# Patient Record
Sex: Female | Born: 1945 | Race: Black or African American | Hispanic: No | Smoking: Never smoker
Health system: Southern US, Community
[De-identification: ages and names within clinical notes are randomized; demographics above are authoritative.]

## PROBLEM LIST (undated history)

## (undated) DIAGNOSIS — D649 Anemia, unspecified: Secondary | ICD-10-CM

## (undated) DIAGNOSIS — C801 Malignant (primary) neoplasm, unspecified: Secondary | ICD-10-CM

## (undated) DIAGNOSIS — K219 Gastro-esophageal reflux disease without esophagitis: Secondary | ICD-10-CM

## (undated) DIAGNOSIS — I10 Essential (primary) hypertension: Secondary | ICD-10-CM

---

## 2011-02-13 DIAGNOSIS — C801 Malignant (primary) neoplasm, unspecified: Secondary | ICD-10-CM

## 2011-02-13 HISTORY — DX: Malignant (primary) neoplasm, unspecified: C80.1

## 2011-02-13 HISTORY — PX: COLON SURGERY: SHX602

## 2013-12-10 ENCOUNTER — Other Ambulatory Visit: Payer: Self-pay | Admitting: Hematology & Oncology

## 2013-12-10 DIAGNOSIS — C19 Malignant neoplasm of rectosigmoid junction: Secondary | ICD-10-CM

## 2013-12-10 DIAGNOSIS — C787 Secondary malignant neoplasm of liver and intrahepatic bile duct: Secondary | ICD-10-CM

## 2013-12-18 ENCOUNTER — Ambulatory Visit
Admission: RE | Admit: 2013-12-18 | Discharge: 2013-12-18 | Disposition: A | Discharge status: Home / Self Care | Payer: Medicare Other | Source: Ambulatory Visit | Attending: Hematology & Oncology | Admitting: Hematology & Oncology

## 2013-12-18 ENCOUNTER — Other Ambulatory Visit: Payer: Self-pay | Admitting: Emergency Medicine

## 2013-12-18 ENCOUNTER — Other Ambulatory Visit: Payer: Self-pay

## 2013-12-18 DIAGNOSIS — C19 Malignant neoplasm of rectosigmoid junction: Secondary | ICD-10-CM

## 2013-12-18 DIAGNOSIS — C787 Secondary malignant neoplasm of liver and intrahepatic bile duct: Secondary | ICD-10-CM

## 2013-12-24 ENCOUNTER — Telehealth: Payer: Self-pay | Admitting: Emergency Medicine

## 2013-12-24 NOTE — Telephone Encounter (Signed)
CALLED PT TO MAKE HER AWARE THAT WE SCHEDULED MRI ABD AT [facility]  FOR 12-27-13 AT 830/800AM  NPO AFTER MIDNIGHT FAXED ORDER TO [facility].

## 2013-12-30 ENCOUNTER — Telehealth: Payer: Self-pay | Admitting: Emergency Medicine

## 2013-12-30 NOTE — Telephone Encounter (Signed)
CALLED PT TO MAKE HER AWARE THAT DR HENN REVIEWED HER MRI- SHE IS GOOD FOR Y-90 PROCEDURE. SENDING OFF FOR INS. TODAY AND WILL HAVE [facility] CONTACT HER WITH HER DATES FOR PROCEDURE.

## 2014-01-01 ENCOUNTER — Other Ambulatory Visit: Payer: Self-pay | Admitting: Diagnostic Radiology

## 2014-01-01 DIAGNOSIS — C787 Secondary malignant neoplasm of liver and intrahepatic bile duct: Secondary | ICD-10-CM

## 2014-01-06 ENCOUNTER — Other Ambulatory Visit: Payer: Self-pay | Admitting: Diagnostic Radiology

## 2014-01-06 DIAGNOSIS — C189 Malignant neoplasm of colon, unspecified: Secondary | ICD-10-CM

## 2014-01-06 DIAGNOSIS — C787 Secondary malignant neoplasm of liver and intrahepatic bile duct: Principal | ICD-10-CM

## 2014-01-16 ENCOUNTER — Encounter (HOSPITAL_COMMUNITY): Payer: Self-pay | Admitting: Pharmacy Technician

## 2014-01-16 ENCOUNTER — Other Ambulatory Visit: Payer: Self-pay | Admitting: Radiology

## 2014-01-20 ENCOUNTER — Encounter (HOSPITAL_COMMUNITY)
Admission: RE | Admit: 2014-01-20 | Discharge: 2014-01-20 | Disposition: A | Discharge status: Home / Self Care | Payer: PRIVATE HEALTH INSURANCE | Source: Ambulatory Visit | Attending: Diagnostic Radiology | Admitting: Diagnostic Radiology

## 2014-01-20 ENCOUNTER — Encounter (HOSPITAL_COMMUNITY): Payer: Self-pay

## 2014-01-20 ENCOUNTER — Other Ambulatory Visit: Payer: Self-pay | Admitting: Diagnostic Radiology

## 2014-01-20 ENCOUNTER — Ambulatory Visit (HOSPITAL_COMMUNITY)
Admission: RE | Admit: 2014-01-20 | Discharge: 2014-01-20 | Disposition: A | Discharge status: Home / Self Care | Payer: PRIVATE HEALTH INSURANCE | Source: Ambulatory Visit | Attending: Diagnostic Radiology | Admitting: Diagnostic Radiology

## 2014-01-20 DIAGNOSIS — C787 Secondary malignant neoplasm of liver and intrahepatic bile duct: Secondary | ICD-10-CM

## 2014-01-20 DIAGNOSIS — C189 Malignant neoplasm of colon, unspecified: Secondary | ICD-10-CM

## 2014-01-20 DIAGNOSIS — Z79899 Other long term (current) drug therapy: Secondary | ICD-10-CM | POA: Insufficient documentation

## 2014-01-20 DIAGNOSIS — I10 Essential (primary) hypertension: Secondary | ICD-10-CM | POA: Insufficient documentation

## 2014-01-20 DIAGNOSIS — K219 Gastro-esophageal reflux disease without esophagitis: Secondary | ICD-10-CM | POA: Insufficient documentation

## 2014-01-20 HISTORY — DX: Essential (primary) hypertension: I10

## 2014-01-20 HISTORY — DX: Malignant (primary) neoplasm, unspecified: C80.1

## 2014-01-20 HISTORY — DX: Gastro-esophageal reflux disease without esophagitis: K21.9

## 2014-01-20 HISTORY — DX: Anemia, unspecified: D64.9

## 2014-01-20 LAB — COMPREHENSIVE METABOLIC PANEL
ALT: 9 U/L (ref 0–35)
AST: 21 U/L (ref 0–37)
Albumin: 3.4 g/dL — ABNORMAL LOW (ref 3.5–5.2)
Alkaline Phosphatase: 147 U/L — ABNORMAL HIGH (ref 39–117)
BILIRUBIN TOTAL: 0.2 mg/dL — AB (ref 0.3–1.2)
BUN: 9 mg/dL (ref 6–23)
CHLORIDE: 100 meq/L (ref 96–112)
CO2: 23 meq/L (ref 19–32)
CREATININE: 0.95 mg/dL (ref 0.50–1.10)
Calcium: 9.6 mg/dL (ref 8.4–10.5)
GFR calc Af Amer: 70 mL/min — ABNORMAL LOW (ref >90)
GFR, EST NON AFRICAN AMERICAN: 60 mL/min — AB (ref >90)
Glucose, Bld: 120 mg/dL — ABNORMAL HIGH (ref 70–99)
POTASSIUM: 4 meq/L (ref 3.7–5.3)
Sodium: 140 mEq/L (ref 137–147)
Total Protein: 8.2 g/dL (ref 6.0–8.3)

## 2014-01-20 LAB — APTT: APTT: 34 s (ref 24–37)

## 2014-01-20 LAB — CBC WITH DIFFERENTIAL/PLATELET
BASOS ABS: 0 10*3/uL (ref 0.0–0.1)
Basophils Relative: 0 % (ref 0–1)
Eosinophils Absolute: 0.2 10*3/uL (ref 0.0–0.7)
Eosinophils Relative: 3 % (ref 0–5)
HCT: 37.4 % (ref 36.0–46.0)
HEMOGLOBIN: 11.9 g/dL — AB (ref 12.0–15.0)
LYMPHS PCT: 33 % (ref 12–46)
Lymphs Abs: 2.2 10*3/uL (ref 0.7–4.0)
MCH: 27.4 pg (ref 26.0–34.0)
MCHC: 31.8 g/dL (ref 30.0–36.0)
MCV: 86 fL (ref 78.0–100.0)
MONO ABS: 0.9 10*3/uL (ref 0.1–1.0)
MONOS PCT: 13 % — AB (ref 3–12)
NEUTROS ABS: 3.5 10*3/uL (ref 1.7–7.7)
NEUTROS PCT: 51 % (ref 43–77)
Platelets: 178 10*3/uL (ref 150–400)
RBC: 4.35 MIL/uL (ref 3.87–5.11)
RDW: 16.3 % — AB (ref 11.5–15.5)
WBC: 6.7 10*3/uL (ref 4.0–10.5)

## 2014-01-20 LAB — PROTIME-INR
INR: 1.03 (ref 0.00–1.49)
Prothrombin Time: 13.3 seconds (ref 11.6–15.2)

## 2014-01-20 MED ORDER — HYDROCODONE-ACETAMINOPHEN 5-325 MG PO TABS: 1.0000 | ORAL_TABLET | ORAL | Status: DC | PRN

## 2014-01-20 MED ORDER — MIDAZOLAM HCL 2 MG/2ML IJ SOLN
INTRAMUSCULAR | Status: AC
  Filled 2014-01-20: qty 6

## 2014-01-20 MED ORDER — IOHEXOL 300 MG/ML  SOLN
80.0000 mL | Freq: Once | INTRAMUSCULAR | Status: AC | PRN
  Administered 2014-01-20: 1 mL via INTRA_ARTERIAL

## 2014-01-20 MED ORDER — FENTANYL CITRATE 0.05 MG/ML IJ SOLN
INTRAMUSCULAR | Status: AC | PRN
  Administered 2014-01-20 (×4): 25 ug via INTRAVENOUS

## 2014-01-20 MED ORDER — MIDAZOLAM HCL 2 MG/2ML IJ SOLN
INTRAMUSCULAR | Status: AC | PRN
  Administered 2014-01-20 (×8): 0.5 mg via INTRAVENOUS

## 2014-01-20 MED ORDER — OMEPRAZOLE 20 MG PO CPDR: 20.0000 mg | DELAYED_RELEASE_CAPSULE | Freq: Every day | ORAL | Status: DC

## 2014-01-20 MED ORDER — SODIUM CHLORIDE 0.9 % IV SOLN
INTRAVENOUS | Status: DC
  Administered 2014-01-20: 08:00:00 via INTRAVENOUS

## 2014-01-20 MED ORDER — LIDOCAINE HCL 1 % IJ SOLN
INTRAMUSCULAR | Status: AC
  Filled 2014-01-20: qty 20

## 2014-01-20 MED ORDER — TECHNETIUM TO 99M ALBUMIN AGGREGATED
3.9000 | Freq: Once | INTRAVENOUS | Status: AC | PRN
  Administered 2014-01-20: 4 via INTRAVENOUS

## 2014-01-20 MED ORDER — FENTANYL CITRATE 0.05 MG/ML IJ SOLN
INTRAMUSCULAR | Status: AC
  Filled 2014-01-20: qty 6

## 2014-01-20 NOTE — Sedation Documentation (Addendum)
5Fr sheath removed from R femoral artery by Dr. Anselm Pancoast.  Hemostasis achieved using Exoseal device.  Manual pressure held X 5 mins.  Groin level 0.  R groin with gauze/tegaderm drsg CDI.

## 2014-01-20 NOTE — Progress Notes (Signed)
Pt returned to room in short stay due to equipment issues in Interventional Radiology.  Pt informed of current situation, presently resting in bed comfortably.  Will continue to monitor.  Awaiting procedure.  Coolidge Breeze, RN 01/20/2014

## 2014-01-20 NOTE — Procedures (Signed)
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  Visceral arteriograms performed.  GDA and right gastric artery embolized with coils.  Tc24mMAA was injected in the left and right hepatic arteries.    Findings: Successful embolization of GDA and right gastric artery. MAA injected in left and right hepatic arteries.  Complications: None     Condition: Stable  Plan: Follow up nuclear medicine exam.  Bedrest 4 hours.  Plan to discharge home later today.

## 2014-01-20 NOTE — H&P (Signed)
Chief Complaint: "I am here for my liver tumor procedure." HPI: Joann Wood is an 68 y.o. female with metastatic colon cancer and liver lesions, she was seen in consult 12/18/13. She was determined to be a candidate for a y-90 radioembolization and was scheduled today for a pre y-90 procedure. She states her last chemotherapy was in January and her next treatment has not been scheduled. She has an appointment tomorrow with her oncologist. She denies any chest pain, shortness of breath or palpitations. She denies any active signs of bleeding or excessive bruising. She denies any recent fever or chills. The patient denies any history of sleep apnea or chronic oxygen use. She has previously tolerated sedation without complications. She denies any history of kidney dysfunction or allergy to iodinated contrast.   Past Medical History:  Past Medical History  Diagnosis Date  . Hypertension   . Cancer     St IV colon CA with mets to liver  . GERD (gastroesophageal reflux disease)   . Anemia     iron deficiency anemia, neutropenia    Past Surgical History:  Past Surgical History  Procedure Laterality Date  . Colon surgery  April 2012    R hemicolectomy and lymph node dissection    Family History: History reviewed. No pertinent family history.  Social History:  reports that she has never smoked. She has never used smokeless tobacco. She reports that she does not use illicit drugs. Her alcohol history is not on file.  Allergies: No Known Allergies :   Medication List    ASK your doctor about these medications       diltiazem 120 MG tablet  Commonly known as:  CARDIZEM  Take 120 mg by mouth every morning. Patients daughter says not ER     metoprolol tartrate 25 MG tablet  Commonly known as:  LOPRESSOR  Take 25 mg by mouth 2 (two) times daily.     potassium chloride SA 20 MEQ tablet  Commonly known as:  K-DUR,KLOR-CON  Take 20 mEq by mouth 2 (two) times daily.       Please HPI for  pertinent positives, otherwise complete 10 system ROS negative.  Physical Exam: BP 139/83  Pulse 80  Temp(Src) 98.6 F (37 C) (Oral)  Resp 18  SpO2 99% There is no weight on file to calculate BMI.  General Appearance:  Alert, cooperative, no distress  Head:  Normocephalic, without obvious abnormality, atraumatic  Neck: Supple, symmetrical, trachea midline  Lungs:   Clear to auscultation bilaterally, no w/r/r, respirations unlabored without use of accessory muscles.  Chest Wall:  No tenderness or deformity  Heart:  Regular rate and rhythm, S1, S2 normal, no murmur, rub or gallop.  Abdomen:   Soft, non-tender, non distended, (+) BS  Extremities: Extremities normal, atraumatic, no cyanosis or edema  Pulses: 1+ and symmetric  Neurologic: Normal affect, no gross deficits.   Results for orders placed during the hospital encounter of 01/20/14 (from the past 48 hour(s))  APTT     Status: None   Collection Time    01/20/14  7:30 AM      Result Value Ref Range   aPTT 34  24 - 37 seconds  CBC WITH DIFFERENTIAL     Status: Abnormal   Collection Time    01/20/14  7:30 AM      Result Value Ref Range   WBC 6.7  4.0 - 10.5 K/uL   RBC 4.35  3.87 - 5.11 MIL/uL   Hemoglobin  11.9 (*) 12.0 - 15.0 g/dL   HCT 37.4  36.0 - 46.0 %   MCV 86.0  78.0 - 100.0 fL   MCH 27.4  26.0 - 34.0 pg   MCHC 31.8  30.0 - 36.0 g/dL   RDW 16.3 (*) 11.5 - 15.5 %   Platelets 178  150 - 400 K/uL   Neutrophils Relative % 51  43 - 77 %   Neutro Abs 3.5  1.7 - 7.7 K/uL   Lymphocytes Relative 33  12 - 46 %   Lymphs Abs 2.2  0.7 - 4.0 K/uL   Monocytes Relative 13 (*) 3 - 12 %   Monocytes Absolute 0.9  0.1 - 1.0 K/uL   Eosinophils Relative 3  0 - 5 %   Eosinophils Absolute 0.2  0.0 - 0.7 K/uL   Basophils Relative 0  0 - 1 %   Basophils Absolute 0.0  0.0 - 0.1 K/uL  COMPREHENSIVE METABOLIC PANEL     Status: Abnormal   Collection Time    01/20/14  7:30 AM      Result Value Ref Range   Sodium 140  137 - 147 mEq/L    Potassium 4.0  3.7 - 5.3 mEq/L   Chloride 100  96 - 112 mEq/L   CO2 23  19 - 32 mEq/L   Glucose, Bld 120 (*) 70 - 99 mg/dL   BUN 9  6 - 23 mg/dL   Creatinine, Ser 0.95  0.50 - 1.10 mg/dL   Calcium 9.6  8.4 - 10.5 mg/dL   Total Protein 8.2  6.0 - 8.3 g/dL   Albumin 3.4 (*) 3.5 - 5.2 g/dL   AST 21  0 - 37 U/L   ALT 9  0 - 35 U/L   Alkaline Phosphatase 147 (*) 39 - 117 U/L   Total Bilirubin 0.2 (*) 0.3 - 1.2 mg/dL   GFR calc non Af Amer 60 (*) >90 mL/min   GFR calc Af Amer 70 (*) >90 mL/min   Comment: (NOTE)     The eGFR has been calculated using the CKD EPI equation.     This calculation has not been validated in all clinical situations.     eGFR's persistently <90 mL/min signify possible Chronic Kidney     Disease.  PROTIME-INR     Status: None   Collection Time    01/20/14  7:30 AM      Result Value Ref Range   Prothrombin Time 13.3  11.6 - 15.2 seconds   INR 1.03  0.00 - 1.49   No results found.  Assessment/Plan Metastatic colon cancer with liver lesions. Seen in consult by Dr. Anselm Pancoast 12/18/13, MRI reviewed. Scheduled today for pre y-90, image guided visceral arteriogram with possible embolization and test dose injection with  Technetium 70M MAA. Patient did have a small piece of bread with her morning medications at 0530 discussed with RN and Dr. Anselm Pancoast will proceed with minimal sedation, no blood thinner, no chemotherapy since January, labs and images reviewed. Risks and Benefits discussed with the patient. All of the patient's questions were answered, patient is agreeable to proceed. Consent signed and in chart.   Tsosie Billing D PA-C 01/20/2014, 9:24 AM

## 2014-01-20 NOTE — Discharge Instructions (Signed)
Moderate Sedation, Adult °Moderate sedation is given to help you relax or even sleep through a procedure. You may remain sleepy, be clumsy, or have poor balance for several hours following this procedure. Arrange for a responsible adult, family member, or friend to take you home. A responsible adult should stay with you for at least 24 hours or until the medicines have worn off. °· Do not participate in any activities where you could become injured for the next 24 hours, or until you feel normal again. Do not: °· Drive. °· Swim. °· Ride a bicycle. °· Operate heavy machinery. °· Cook. °· Use power tools. °· Climb ladders. °· Work at heights. °· Do not make important decisions or sign legal documents until you are improved. °· Vomiting may occur if you eat too soon. When you can drink without vomiting, try water, juice, or soup. Try solid foods if you feel little or no nausea. °· Only take over-the-counter or prescription medications for pain, discomfort, or fever as directed by your caregiver.If pain medications have been prescribed for you, ask your caregiver how soon it is safe to take them. °· Make sure you and your family fully understands everything about the medication given to you. Make sure you understand what side effects may occur. °· You should not drink alcohol, take sleeping pills, or medications that cause drowsiness for at least 24 hours. °· If you smoke, do not smoke alone. °· If you are feeling better, you may resume normal activities 24 hours after receiving sedation. °· Keep all appointments as scheduled. Follow all instructions. °· Ask questions if you do not understand. °SEEK MEDICAL CARE IF:  °· Your skin is pale or bluish in color. °· You continue to feel sick to your stomach (nauseous) or throw up (vomit). °· Your pain is getting worse and not helped by medication. °· You have bleeding or swelling. °· You are still sleepy or feeling clumsy after 24 hours. °SEEK IMMEDIATE MEDICAL CARE IF:   °· You develop a rash. °· You have difficulty breathing. °· You develop any type of allergic problem. °· You have a fever. °Document Released: 07/26/2001 Document Revised: 01/23/2012 Document Reviewed: 07/08/2013 °[employer]® Patient Information ©2014 [employer]. °Post Y-90 Radioembolization Discharge Instructions ° °You have been given a radioactive material during your procedure.  While it is safe for you to be discharged home from the hospital, you need to proceed directly home.   ° °Do not use public transportation, including air travel, lasting more than 2 hours for 1 week. ° °Avoid crowded public places for 1 week. ° °Adult visitors should try to avoid close contact with you for 1 week.   ° °Children and pregnant females should not visit or have close contact with you for 1 week. ° °Items that you touch are not radioactive. ° °Do not sleep in the same bed as your partner for 1 week, and a condom should be used for sexual activity during the first 24 hours. ° °Your blood may be radioactive and caution should be used if any bleeding occurs during the recovery period. ° °Body fluids may be radioactive for 24 hours.  Wash your hands after voiding.  Men should sit to urinate.  Dispose of any soiled materials (flush down toilet or place in trash at home) during the first day. ° °Drink 6 to 8 glasses of fluids per day for 5 days to hydrate yourself. ° °If you need to see a doctor during the first week, you   must let them know that you were treated with yttrium-90 microspheres, and will be slightly radioactive.  They can call Interventional Radiology [phone] with any questions.

## 2014-01-20 NOTE — Sedation Documentation (Signed)
Pt transported to Short Stay via stretcher for recovery.

## 2014-01-20 NOTE — Sedation Documentation (Signed)
Pt arrived in [place] for post Pre Y-90 scan.

## 2014-01-29 ENCOUNTER — Other Ambulatory Visit: Payer: Self-pay | Admitting: Radiology

## 2014-01-30 ENCOUNTER — Encounter (HOSPITAL_COMMUNITY): Payer: Self-pay | Admitting: Pharmacy Technician

## 2014-02-03 ENCOUNTER — Other Ambulatory Visit: Payer: Self-pay | Admitting: Radiology

## 2014-02-04 ENCOUNTER — Ambulatory Visit (HOSPITAL_COMMUNITY)
Admission: RE | Admit: 2014-02-04 | Discharge: 2014-02-04 | Disposition: A | Discharge status: Home / Self Care | Payer: PRIVATE HEALTH INSURANCE | Source: Ambulatory Visit | Attending: Diagnostic Radiology | Admitting: Diagnostic Radiology

## 2014-02-04 ENCOUNTER — Encounter (HOSPITAL_COMMUNITY)
Admission: RE | Admit: 2014-02-04 | Discharge: 2014-02-04 | Disposition: A | Discharge status: Home / Self Care | Payer: PRIVATE HEALTH INSURANCE | Source: Ambulatory Visit | Attending: Diagnostic Radiology | Admitting: Diagnostic Radiology

## 2014-02-04 ENCOUNTER — Encounter (HOSPITAL_COMMUNITY): Payer: Self-pay

## 2014-02-04 ENCOUNTER — Other Ambulatory Visit (HOSPITAL_COMMUNITY): Payer: Self-pay | Admitting: Diagnostic Radiology

## 2014-02-04 ENCOUNTER — Other Ambulatory Visit: Payer: Self-pay | Admitting: Diagnostic Radiology

## 2014-02-04 DIAGNOSIS — I10 Essential (primary) hypertension: Secondary | ICD-10-CM | POA: Insufficient documentation

## 2014-02-04 DIAGNOSIS — Z79899 Other long term (current) drug therapy: Secondary | ICD-10-CM | POA: Insufficient documentation

## 2014-02-04 DIAGNOSIS — C787 Secondary malignant neoplasm of liver and intrahepatic bile duct: Secondary | ICD-10-CM

## 2014-02-04 DIAGNOSIS — C189 Malignant neoplasm of colon, unspecified: Secondary | ICD-10-CM

## 2014-02-04 DIAGNOSIS — K219 Gastro-esophageal reflux disease without esophagitis: Secondary | ICD-10-CM | POA: Insufficient documentation

## 2014-02-04 DIAGNOSIS — Z1509 Genetic susceptibility to other malignant neoplasm: Secondary | ICD-10-CM | POA: Insufficient documentation

## 2014-02-04 DIAGNOSIS — Z9049 Acquired absence of other specified parts of digestive tract: Secondary | ICD-10-CM | POA: Insufficient documentation

## 2014-02-04 LAB — COMPREHENSIVE METABOLIC PANEL
ALT: 10 U/L (ref 0–35)
AST: 22 U/L (ref 0–37)
Albumin: 3.4 g/dL — ABNORMAL LOW (ref 3.5–5.2)
Alkaline Phosphatase: 167 U/L — ABNORMAL HIGH (ref 39–117)
BILIRUBIN TOTAL: 0.4 mg/dL (ref 0.3–1.2)
BUN: 11 mg/dL (ref 6–23)
CALCIUM: 9.8 mg/dL (ref 8.4–10.5)
CHLORIDE: 102 meq/L (ref 96–112)
CO2: 24 meq/L (ref 19–32)
Creatinine, Ser: 0.8 mg/dL (ref 0.50–1.10)
GFR calc Af Amer: 86 mL/min — ABNORMAL LOW (ref >90)
GFR, EST NON AFRICAN AMERICAN: 74 mL/min — AB (ref >90)
GLUCOSE: 96 mg/dL (ref 70–99)
Potassium: 4.3 mEq/L (ref 3.7–5.3)
SODIUM: 141 meq/L (ref 137–147)
Total Protein: 8.3 g/dL (ref 6.0–8.3)

## 2014-02-04 LAB — CBC WITH DIFFERENTIAL/PLATELET
Basophils Absolute: 0 10*3/uL (ref 0.0–0.1)
Basophils Relative: 1 % (ref 0–1)
Eosinophils Absolute: 0.2 10*3/uL (ref 0.0–0.7)
Eosinophils Relative: 4 % (ref 0–5)
HEMATOCRIT: 34.8 % — AB (ref 36.0–46.0)
HEMOGLOBIN: 11.1 g/dL — AB (ref 12.0–15.0)
LYMPHS ABS: 1.9 10*3/uL (ref 0.7–4.0)
Lymphocytes Relative: 31 % (ref 12–46)
MCH: 27 pg (ref 26.0–34.0)
MCHC: 31.9 g/dL (ref 30.0–36.0)
MCV: 84.7 fL (ref 78.0–100.0)
MONO ABS: 1 10*3/uL (ref 0.1–1.0)
MONOS PCT: 17 % — AB (ref 3–12)
NEUTROS ABS: 2.9 10*3/uL (ref 1.7–7.7)
Neutrophils Relative %: 48 % (ref 43–77)
Platelets: 197 10*3/uL (ref 150–400)
RBC: 4.11 MIL/uL (ref 3.87–5.11)
RDW: 15.9 % — ABNORMAL HIGH (ref 11.5–15.5)
WBC: 6.1 10*3/uL (ref 4.0–10.5)

## 2014-02-04 LAB — PROTIME-INR
INR: 1.01 (ref 0.00–1.49)
Prothrombin Time: 13.1 seconds (ref 11.6–15.2)

## 2014-02-04 LAB — APTT: aPTT: 30 seconds (ref 24–37)

## 2014-02-04 MED ORDER — MIDAZOLAM HCL 2 MG/2ML IJ SOLN
INTRAMUSCULAR | Status: AC
  Filled 2014-02-04: qty 6

## 2014-02-04 MED ORDER — FENTANYL CITRATE 0.05 MG/ML IJ SOLN
INTRAMUSCULAR | Status: DC | PRN
  Administered 2014-02-04 (×3): 25 ug via INTRAVENOUS

## 2014-02-04 MED ORDER — FENTANYL CITRATE 0.05 MG/ML IJ SOLN
INTRAMUSCULAR | Status: AC
  Filled 2014-02-04: qty 6

## 2014-02-04 MED ORDER — LIDOCAINE HCL 1 % IJ SOLN
INTRAMUSCULAR | Status: AC
  Filled 2014-02-04: qty 20

## 2014-02-04 MED ORDER — OMEPRAZOLE 20 MG PO CPDR: 20.0000 mg | DELAYED_RELEASE_CAPSULE | Freq: Every day | ORAL | Status: AC

## 2014-02-04 MED ORDER — DEXAMETHASONE SODIUM PHOSPHATE 10 MG/ML IJ SOLN
20.0000 mg | Freq: Once | INTRAMUSCULAR | Status: AC
  Administered 2014-02-04: 20 mg via INTRAVENOUS
  Filled 2014-02-04 (×2): qty 2

## 2014-02-04 MED ORDER — PANTOPRAZOLE SODIUM 40 MG IV SOLR
40.0000 mg | Freq: Once | INTRAVENOUS | Status: AC
  Administered 2014-02-04: 40 mg via INTRAVENOUS
  Filled 2014-02-04: qty 40

## 2014-02-04 MED ORDER — IOHEXOL 300 MG/ML  SOLN
80.0000 mL | Freq: Once | INTRAMUSCULAR | Status: AC | PRN
  Administered 2014-02-04: 130 mL

## 2014-02-04 MED ORDER — MIDAZOLAM HCL 2 MG/2ML IJ SOLN
INTRAMUSCULAR | Status: DC | PRN
  Administered 2014-02-04 (×4): 0.5 mg via INTRAVENOUS

## 2014-02-04 MED ORDER — PIPERACILLIN-TAZOBACTAM 3.375 G IVPB
3.3750 g | Freq: Once | INTRAVENOUS | Status: AC
  Administered 2014-02-04: 3.375 g via INTRAVENOUS
  Filled 2014-02-04: qty 50

## 2014-02-04 MED ORDER — YTTRIUM 90 INJECTION
19.8200 | INJECTION | Freq: Once | INTRAVENOUS | Status: DC
Start: 2014-02-04 — End: 2014-02-10

## 2014-02-04 MED ORDER — SODIUM CHLORIDE 0.9 % IV SOLN
Freq: Once | INTRAVENOUS | Status: AC
  Administered 2014-02-04: 500 mL via INTRAVENOUS

## 2014-02-04 MED ORDER — ONDANSETRON HCL 4 MG/2ML IJ SOLN
4.0000 mg | Freq: Once | INTRAMUSCULAR | Status: AC
  Administered 2014-02-04: 4 mg via INTRAVENOUS
  Filled 2014-02-04: qty 2

## 2014-02-04 NOTE — Discharge Instructions (Signed)
Post Y-90 Radioembolization Discharge Instructions  You have been given a radioactive material during your procedure.  While it is safe for you to be discharged home from the hospital, you need to proceed directly home.    Do not use public transportation, including air travel, lasting more than 2 hours for 1 week.  Avoid crowded public places for 1 week.  Adult visitors should try to avoid close contact with you for 1 week.    Children and pregnant females should not visit or have close contact with you for 1 week.  Items that you touch are not radioactive.  Do not sleep in the same bed as your partner for 1 week, and a condom should be used for sexual activity during the first 24 hours.  Your blood may be radioactive and caution should be used if any bleeding occurs during the recovery period.  Body fluids may be radioactive for 24 hours.  Wash your hands after voiding.  Men should sit to urinate.  Dispose of any soiled materials (flush down toilet or place in trash at home) during the first day.  Drink 6 to 8 glasses of fluids per day for 5 days to hydrate yourself.  If you need to see a doctor during the first week, you must let them know that you were treated with yttrium-90 microspheres, and will be slightly radioactive.  They can call Interventional Radiology [phone] with any questions.Moderate Sedation, Adult Moderate sedation is given to help you relax or even sleep through a procedure. You may remain sleepy, be clumsy, or have poor balance for several hours following this procedure. Arrange for a responsible adult, family member, or friend to take you home. A responsible adult should stay with you for at least 24 hours or until the medicines have worn off.  Do not participate in any activities where you could become injured for the next 24 hours, or until you feel normal again. Do not:  Drive.  Swim.  Ride a bicycle.  Operate heavy machinery.  Cook.  Use power  tools.  Climb ladders.  Work at [employer].  Do not make important decisions or sign legal documents until you are improved.  Vomiting may occur if you eat too soon. When you can drink without vomiting, try water, juice, or soup. Try solid foods if you feel little or no nausea.  Only take over-the-counter or prescription medications for pain, discomfort, or fever as directed by your caregiver.If pain medications have been prescribed for you, ask your caregiver how soon it is safe to take them.  Make sure you and your family fully understands everything about the medication given to you. Make sure you understand what side effects may occur.  You should not drink alcohol, take sleeping pills, or medications that cause drowsiness for at least 24 hours.  If you smoke, do not smoke alone.  If you are feeling better, you may resume normal activities 24 hours after receiving sedation.  Keep all appointments as scheduled. Follow all instructions.  Ask questions if you do not understand. SEEK MEDICAL CARE IF:   Your skin is pale or bluish in color.  You continue to feel sick to your stomach (nauseous) or throw up (vomit).  Your pain is getting worse and not helped by medication.  You have bleeding or swelling.  You are still sleepy or feeling clumsy after 24 hours. SEEK IMMEDIATE MEDICAL CARE IF:   You develop a rash.  You have difficulty breathing.  You develop  any type of allergic problem.  You have a fever. Document Released: 07/26/2001 Document Revised: 01/23/2012 Document Reviewed: 07/08/2013 [facility] Patient Information 2014 [place].

## 2014-02-04 NOTE — H&P (Signed)
Chief Complaint: "I am here for my liver tumor procedure." HPI: Joann Wood is an 68 y.o. female with metastatic colon cancer and liver lesions, she was seen in consult 12/18/13. She was determined to be a candidate for a y-90 radioembolization and has already undergone pre Y-90 angiogram with coil embolization of the GDA and right gastric arteries. She is now scheduled for Y-90 radioembolization of hepatic metastasis.  She reports doing very well with her angiogram and coil embo. She denies any abd pain, N/V.  She denies any pain or swelling in her right groin or in her right leg. She denies any chest pain, shortness of breath or palpitations. She denies any recent fever or chills. The patient denies any history of sleep apnea or chronic oxygen use. She has previously tolerated sedation without complications. She denies any history of kidney dysfunction or allergy to iodinated contrast.  PMHx and meds reviewed. She has been NPO this am except a sip of water for her home meds.  Past Medical History:  Past Medical History  Diagnosis Date  . Hypertension   . GERD (gastroesophageal reflux disease)   . Anemia     iron deficiency anemia, neutropenia  . 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 Colon surgery  April 2012    R hemicolectomy and lymph node dissection    Family History: History reviewed. No pertinent family history.  Social History:  reports that she has never smoked. She has never used smokeless tobacco. She reports that she does not use illicit drugs. Her alcohol history is not on file.  Allergies: No Known Allergies :   Medication List    ASK your doctor about these medications       diltiazem 120 MG tablet  Commonly known as:  CARDIZEM  Take 120 mg by mouth every morning. Patients daughter says not ER     metoprolol tartrate 25 MG tablet  Commonly known as:   LOPRESSOR  Take 25 mg by mouth 2 (two) times daily.     potassium chloride SA 20 MEQ tablet  Commonly known as:  K-DUR,KLOR-CON  Take 20 mEq by mouth 2 (two) times daily.       Please HPI for pertinent positives, otherwise complete 10 system ROS negative.  Physical Exam: Temp: 97.9, HR: 73, RR: 18, BP: 153/95, O2: 99%  General Appearance:  Alert, cooperative, no distress  Head:  Normocephalic, without obvious abnormality, atraumatic  Neck: Supple, symmetrical, trachea midline  Lungs:   Clear to auscultation bilaterally, no w/r/r, respirations unlabored without use of accessory muscles.  Chest Wall:  No tenderness or deformity  Heart:  Regular rate and rhythm, S1, S2 normal, no murmur, rub or gallop.  Abdomen:   Soft, non-tender, non distended, (+) BS  Extremities: Extremities normal, atraumatic, no cyanosis or edema  Pulses: 1+ and symmetric femoral and DP  Neurologic: Normal affect, no gross deficits.   Results for orders placed during the hospital encounter of 02/04/14 (from the past 48 hour(s))  APTT     Status: None   Collection Time    02/04/14  7:50 AM      Result Value Ref Range   aPTT 30  24 - 37 seconds  CBC WITH DIFFERENTIAL     Status: Abnormal   Collection Time    02/04/14  7:50 AM  Result Value Ref Range   WBC 6.1  4.0 - 10.5 K/uL   RBC 4.11  3.87 - 5.11 MIL/uL   Hemoglobin 11.1 (*) 12.0 - 15.0 g/dL   HCT 34.8 (*) 36.0 - 46.0 %   MCV 84.7  78.0 - 100.0 fL   MCH 27.0  26.0 - 34.0 pg   MCHC 31.9  30.0 - 36.0 g/dL   RDW 15.9 (*) 11.5 - 15.5 %   Platelets 197  150 - 400 K/uL   Neutrophils Relative % 48  43 - 77 %   Neutro Abs 2.9  1.7 - 7.7 K/uL   Lymphocytes Relative 31  12 - 46 %   Lymphs Abs 1.9  0.7 - 4.0 K/uL   Monocytes Relative 17 (*) 3 - 12 %   Monocytes Absolute 1.0  0.1 - 1.0 K/uL   Eosinophils Relative 4  0 - 5 %   Eosinophils Absolute 0.2  0.0 - 0.7 K/uL   Basophils Relative 1  0 - 1 %   Basophils Absolute 0.0  0.0 - 0.1 K/uL  PROTIME-INR      Status: None   Collection Time    02/04/14  7:50 AM      Result Value Ref Range   Prothrombin Time 13.1  11.6 - 15.2 seconds   INR 1.01  0.00 - 1.49   No results found.  Assessment/Plan Metastatic colon cancer with liver lesions. Seen in consult by Dr. Anselm Pancoast 12/18/13, MRI reviewed. Underwent Visceral angio with coil embo of GDA and right gastric arteries 2 weeks ago. Test dose complete and now scheduled for Y-90 radioembolization of hepatic lesions. Procedure, risks, complications, use of sedation and recovery expectations thoroughly discussed with pt and son. All of the patient's questions were answered, patient is agreeable to proceed. Consent signed and in chart.   Ascencion Dike PA-C 02/04/2014, 8:25 AM

## 2014-02-04 NOTE — Sedation Documentation (Signed)
Gauze/tegaderm bandage applied to R fem artery puncture site.  CDI.  3+RDP.

## 2014-02-04 NOTE — Procedures (Signed)
Post-Procedure Note  Pre-operative Diagnosis: Metastatic colon cancer to liver       Post-operative Diagnosis: Same   Indications:  Metastatic disease in liver  Procedure Details:   Informed consent obtained.  Visceral angiograms performed.  Left hepatic artery was selected.  Infused Y90 Sirspheres in left hepatic lobe.  Entire dose was given.  Right groin sheath removed with Exoseal closure device.   Findings: Patent hepatic arteries.  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  Dose given in distal left hepatic artery.  No stasis.  Forward flow in left hepatic artery at end of procedure.    Complications: none      Condition: stable  Plan: Nuclear medicine exam and then recover in Short Stay.  Anticipate going home today.

## 2014-02-04 NOTE — Sedation Documentation (Signed)
5 Fr sheath removed from R femoral artery by Dr. Anselm Pancoast.  Hemostasis achieved using Exoseal device.  Groin level 0.  Manual pressure applied X 5 minutes.

## 2014-02-06 ENCOUNTER — Other Ambulatory Visit: Payer: Self-pay | Admitting: Emergency Medicine

## 2014-02-06 DIAGNOSIS — C787 Secondary malignant neoplasm of liver and intrahepatic bile duct: Secondary | ICD-10-CM

## 2014-02-27 ENCOUNTER — Ambulatory Visit
Admission: RE | Admit: 2014-02-27 | Discharge: 2014-02-27 | Disposition: A | Discharge status: Home / Self Care | Payer: Medicare Other | Source: Ambulatory Visit | Attending: Diagnostic Radiology | Admitting: Diagnostic Radiology

## 2014-02-27 DIAGNOSIS — C787 Secondary malignant neoplasm of liver and intrahepatic bile duct: Secondary | ICD-10-CM

## 2014-02-27 DIAGNOSIS — C189 Malignant neoplasm of colon, unspecified: Secondary | ICD-10-CM

## 2014-02-27 NOTE — Progress Notes (Signed)
Appetite:  Good.  Denies nausea, vomiting or diarrhea.  Denies pain.   Sleeping well.    Walking approx 20 min/day (distance approx 6 blocks).  Jamari Diana Riki Rusk, RN 02/27/2014 1:26 PM

## 2014-03-06 ENCOUNTER — Telehealth: Payer: Self-pay | Admitting: Emergency Medicine

## 2014-03-06 NOTE — Telephone Encounter (Signed)
PT CALLED AND WANTED TO KNOW IF SHE NEEDED TO CONTINUE HER OMEPRAZOLE.   PER DR HENN, PT TO OBTAIN ANOTHER 30DAYS QD AND THEN SHE SHOULD BE DONE. THIS IS AN OTC MEDICINE    PT AWARE OF THE ABOVE AND WILL GET MORE.

## 2014-08-07 ENCOUNTER — Other Ambulatory Visit (HOSPITAL_COMMUNITY): Payer: Self-pay | Admitting: Diagnostic Radiology

## 2014-08-07 DIAGNOSIS — C787 Secondary malignant neoplasm of liver and intrahepatic bile duct: Principal | ICD-10-CM

## 2014-08-07 DIAGNOSIS — C189 Malignant neoplasm of colon, unspecified: Secondary | ICD-10-CM

## 2014-08-07 NOTE — Addendum Note (Signed)
Encounter addended by: Andris Baumann, RN on: 08/07/2014  8:22 AM<BR>     Documentation filed: Scan

## 2014-08-27 ENCOUNTER — Inpatient Hospital Stay: Admission: RE | Admit: 2014-08-27 | Payer: Medicare Other | Source: Ambulatory Visit

## 2014-09-30 ENCOUNTER — Inpatient Hospital Stay: Admission: RE | Admit: 2014-09-30 | Payer: Medicare Other | Source: Ambulatory Visit

## 2014-11-12 ENCOUNTER — Encounter: Payer: Self-pay | Admitting: Radiology

## 2015-07-16 DEATH — deceased

## 2016-01-12 IMAGING — NM NM MISC PROCEDURE
5 series · 45 of 45 positions shown · non-contrast
Comparison: none

[Series 1: mc (age) micro · 4.7mm · 4.75mm/px · 6 of 91 frames shown (1 of 4)]
[frame 8/91]
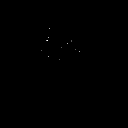
[frame 23/91]
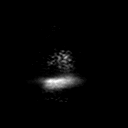
[frame 38/91]
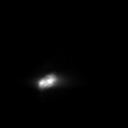
[frame 53/91]
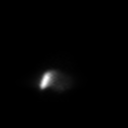
[frame 68/91]
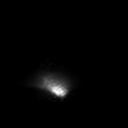
[frame 84/91]
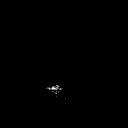

[Series 1: mc (age) micro · 4.7mm · 4.75mm/px · 6 of 91 frames shown (2 of 4)]
[frame 8/91]
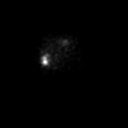
[frame 23/91]
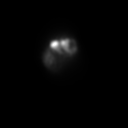
[frame 38/91]
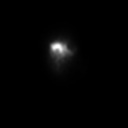
[frame 53/91]
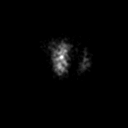
[frame 68/91]
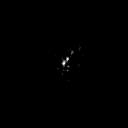
[frame 84/91]
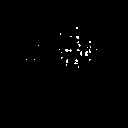

[Series 1: mc (age) micro · 4.75mm/px · 6 of 64 frames shown (3 of 4)]
[frame 6/64]
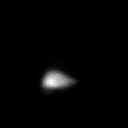
[frame 16/64]
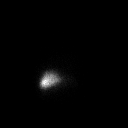
[frame 27/64]
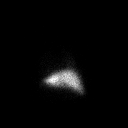
[frame 38/64]
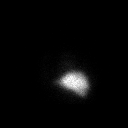
[frame 48/64]
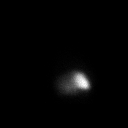
[frame 59/64]
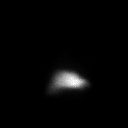

[Series 1: mc (age) micro · 4.7mm · 4.75mm/px · 6 of 91 frames shown (4 of 4)]
[frame 8/91]
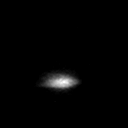
[frame 23/91]
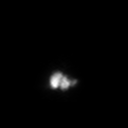
[frame 38/91]
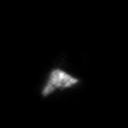
[frame 53/91]
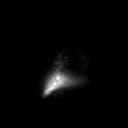
[frame 68/91]
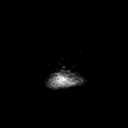
[frame 84/91]
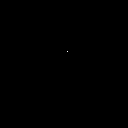

[mpr tra 5mm range · 2.63mm/px · 21 of 46 slices shown]
[im 1/46]
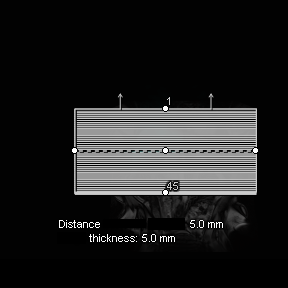
[im 3/46]
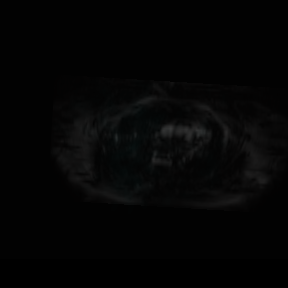
[im 5/46]
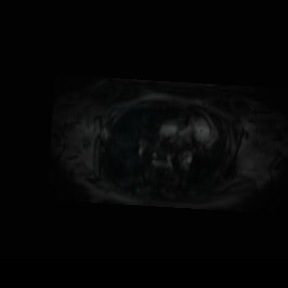
[im 7/46]
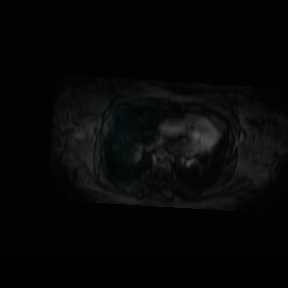
[im 10/46]
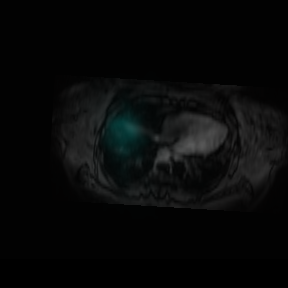
[im 12/46]
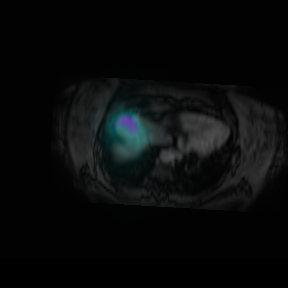
[im 14/46]
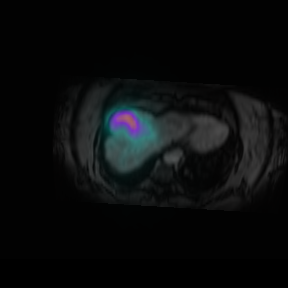
[im 16/46]
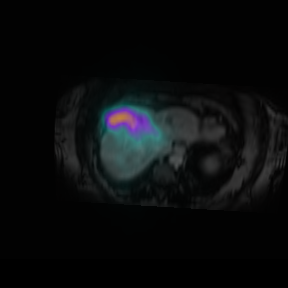
[im 19/46]
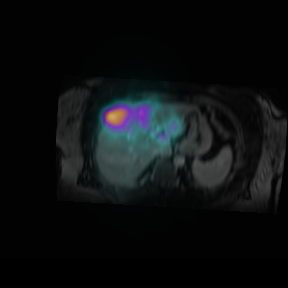
[im 21/46]
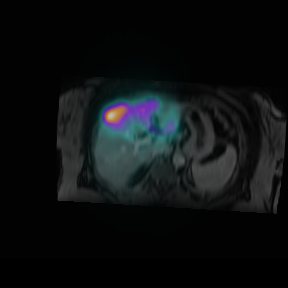
[im 23/46]
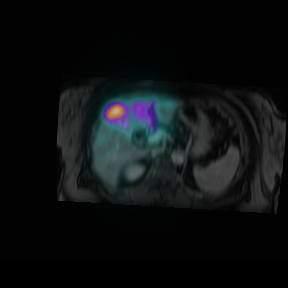
[im 25/46]
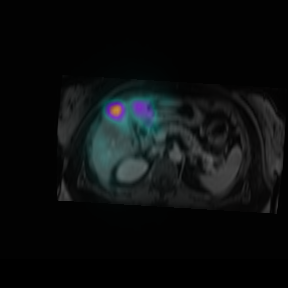
[im 28/46]
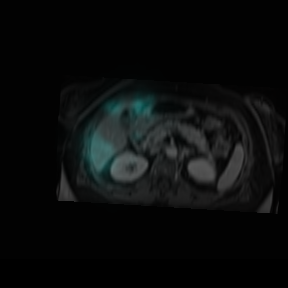
[im 30/46]
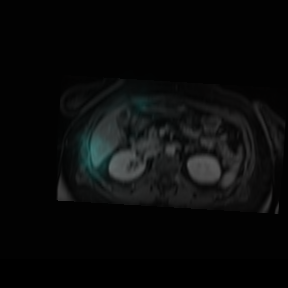
[im 32/46]
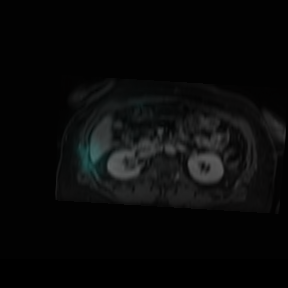
[im 34/46]
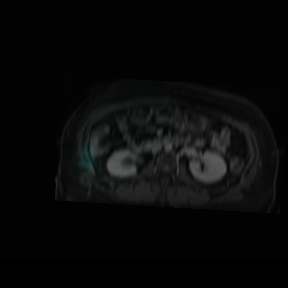
[im 37/46]
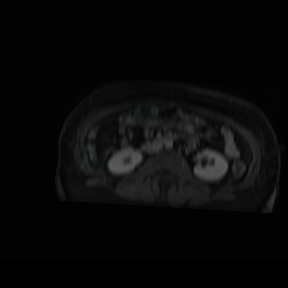
[im 39/46]
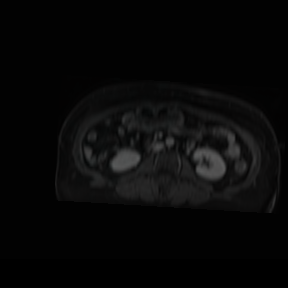
[im 41/46]
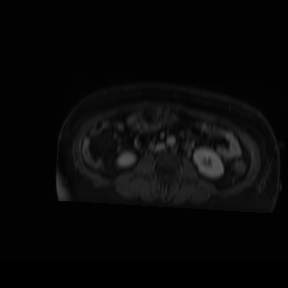
[im 43/46]
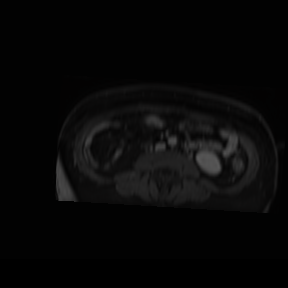
[im 46/46]
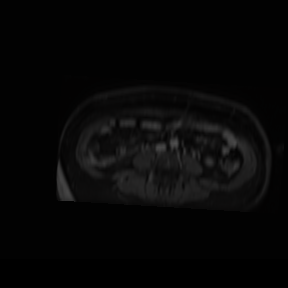

[45 of 45 positions shown; findings below may reference images not displayed]

CLINICAL DATA
Pre [AGE] evaluation. Patient with metastatic liver disease
unresectable. Stage IV colon cancer

EXAM
NUCLEAR MEDICINE LIVER - SPLEEN SCAN

TECHNIQUE
Abdominal images were obtained in multiple projections after
intravenous injection of radiopharmaceutical.

RADIOPHARMACEUTICALS
YYXIIX IKUSHIGE RUPAKHETI TECHNETIUM TO 99M ALBUMIN AGGREGATED mCi Jc-33m
sulfur colloid

COMPARISON
MRI 12/27/2013

FINDINGS
Injection of radiotracer into the hepatic artery demonstrates
localization to the of the radiotracer within the liver parenchyma.
No extrahepatic non target activity noted.

Minimal lung shunt activity with the calculated lung shunt fraction
= 4%.

IMPRESSION
1. No significant extrahepatic non target activity.
2. Lung shunt fraction equals 4%.

SIGNATURE
